# Patient Record
Sex: Female | Born: 1970 | Race: White | Hispanic: No | Marital: Married
Health system: Midwestern US, Academic
[De-identification: ages and names within clinical notes are randomized; demographics above are authoritative.]

---

## 2015-12-13 IMAGING — CR CHEST
1 series · 1 of 1 positions shown · non-contrast
Comparison: None

EXAM: CHEST
REASON FOR EXAM: Productive cough x2 weeks
TECHNIQUE: PA   Lateral

[chest lat]
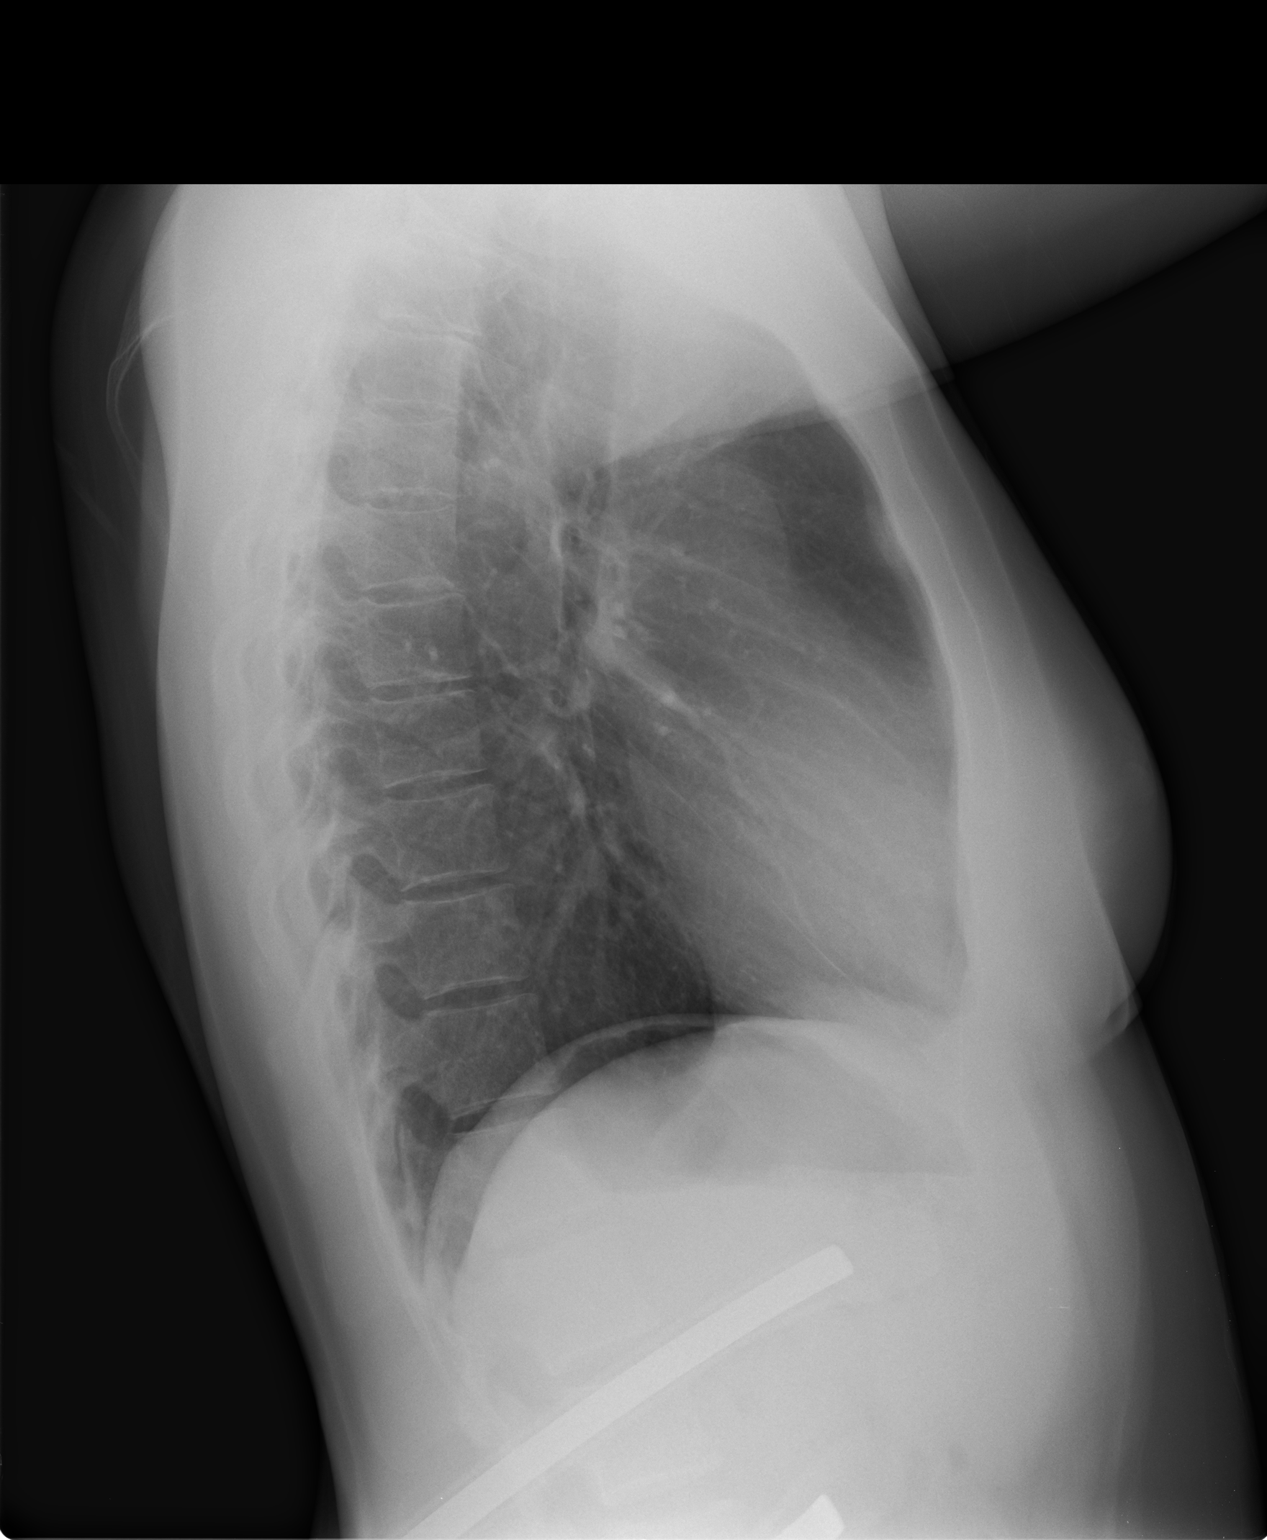

[1 of 1 positions shown; findings below may reference images not displayed]

IMPRESSION: No acute cardiopulmonary process
FINDINGS: Lungs are well aerated.  There is no focal consolidation.  Costophrenic
angles are clear.  Cardiomediastinal silhouette within normal limits.

Comments:  Findings discussed with Hi, RN for Dominick, See at 5511  hours
today.

Dictated by Onacram, Don Lolito
Preliminary report until reviewed and verified by Pajot, Vladilen

Tech Notes: PRODUCTIVE COUGH, FATIGUE, LT SIDED CHEST PAIN, BODY ACHES, SOA X  1 [DATE] WEEKS.  DENIED
PREGNANCY.  SHIELDED.  AB

## 2017-04-17 IMAGING — CR CHEST
2 series · 2 of 2 positions shown · non-contrast
Comparison: none

[chest pa]
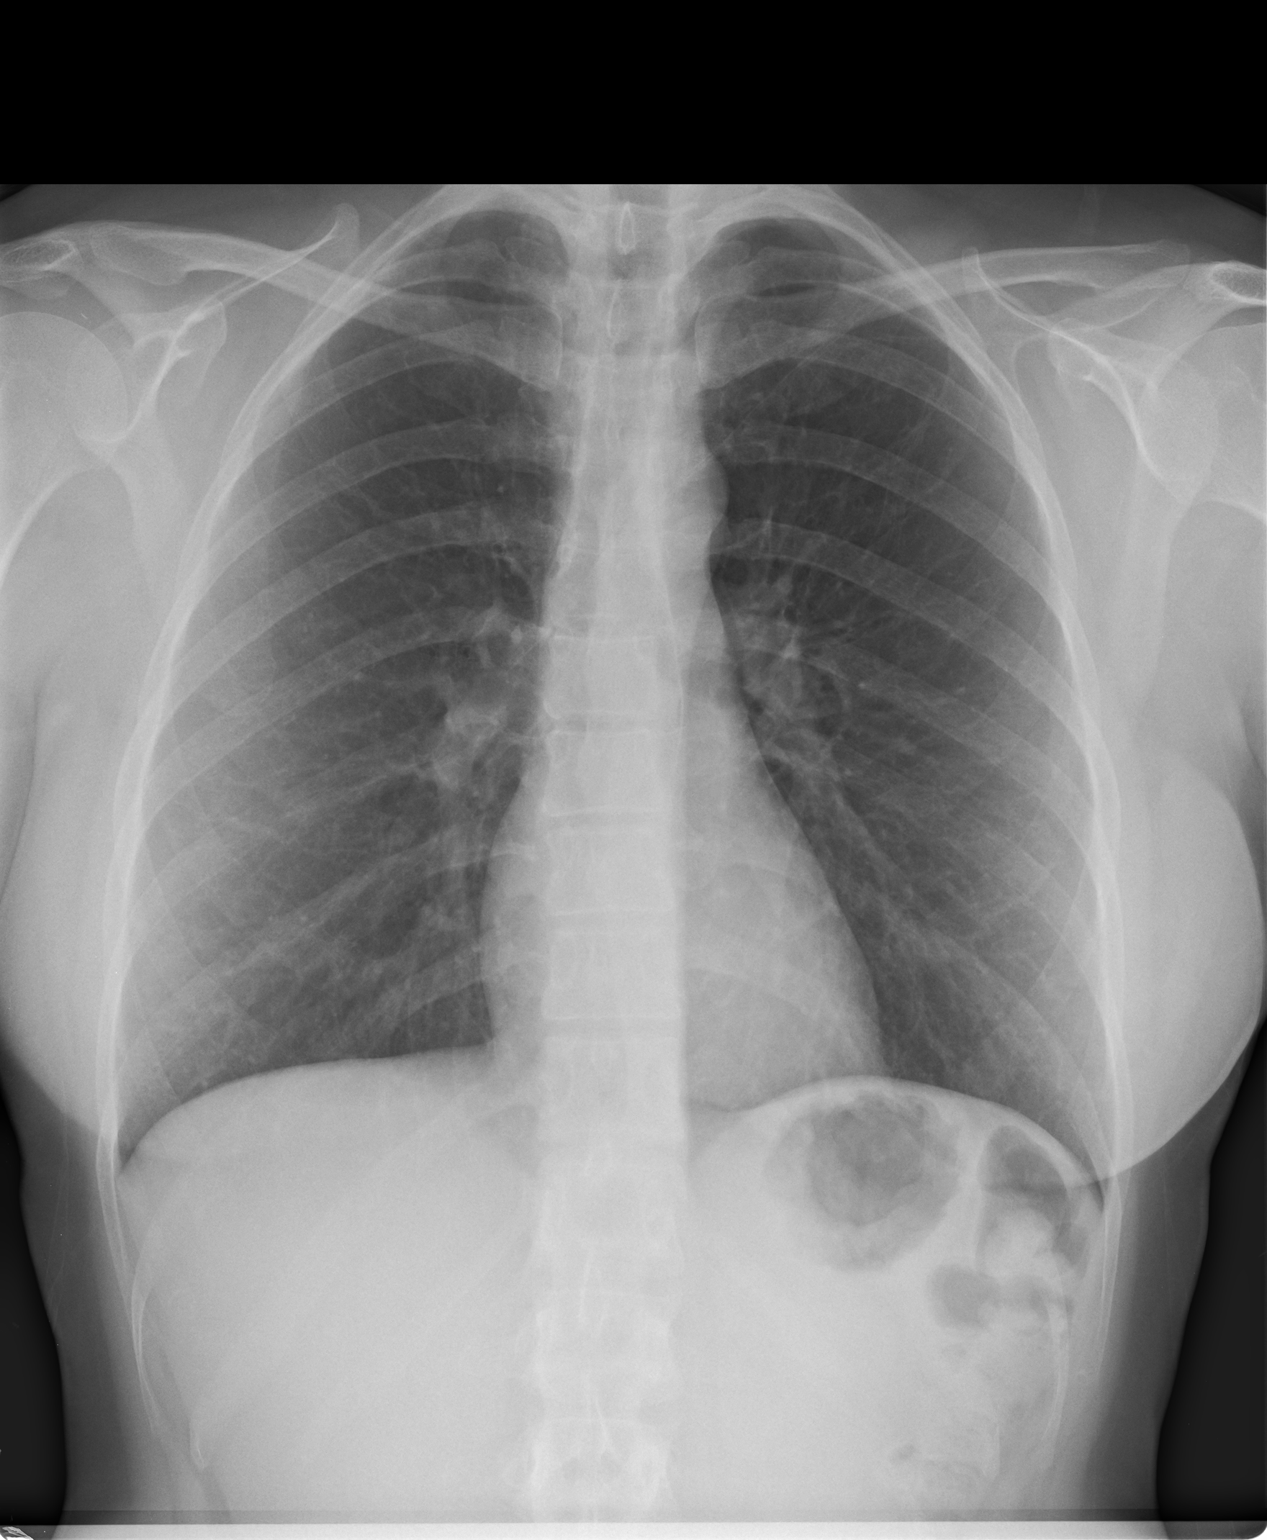

[chest lat]
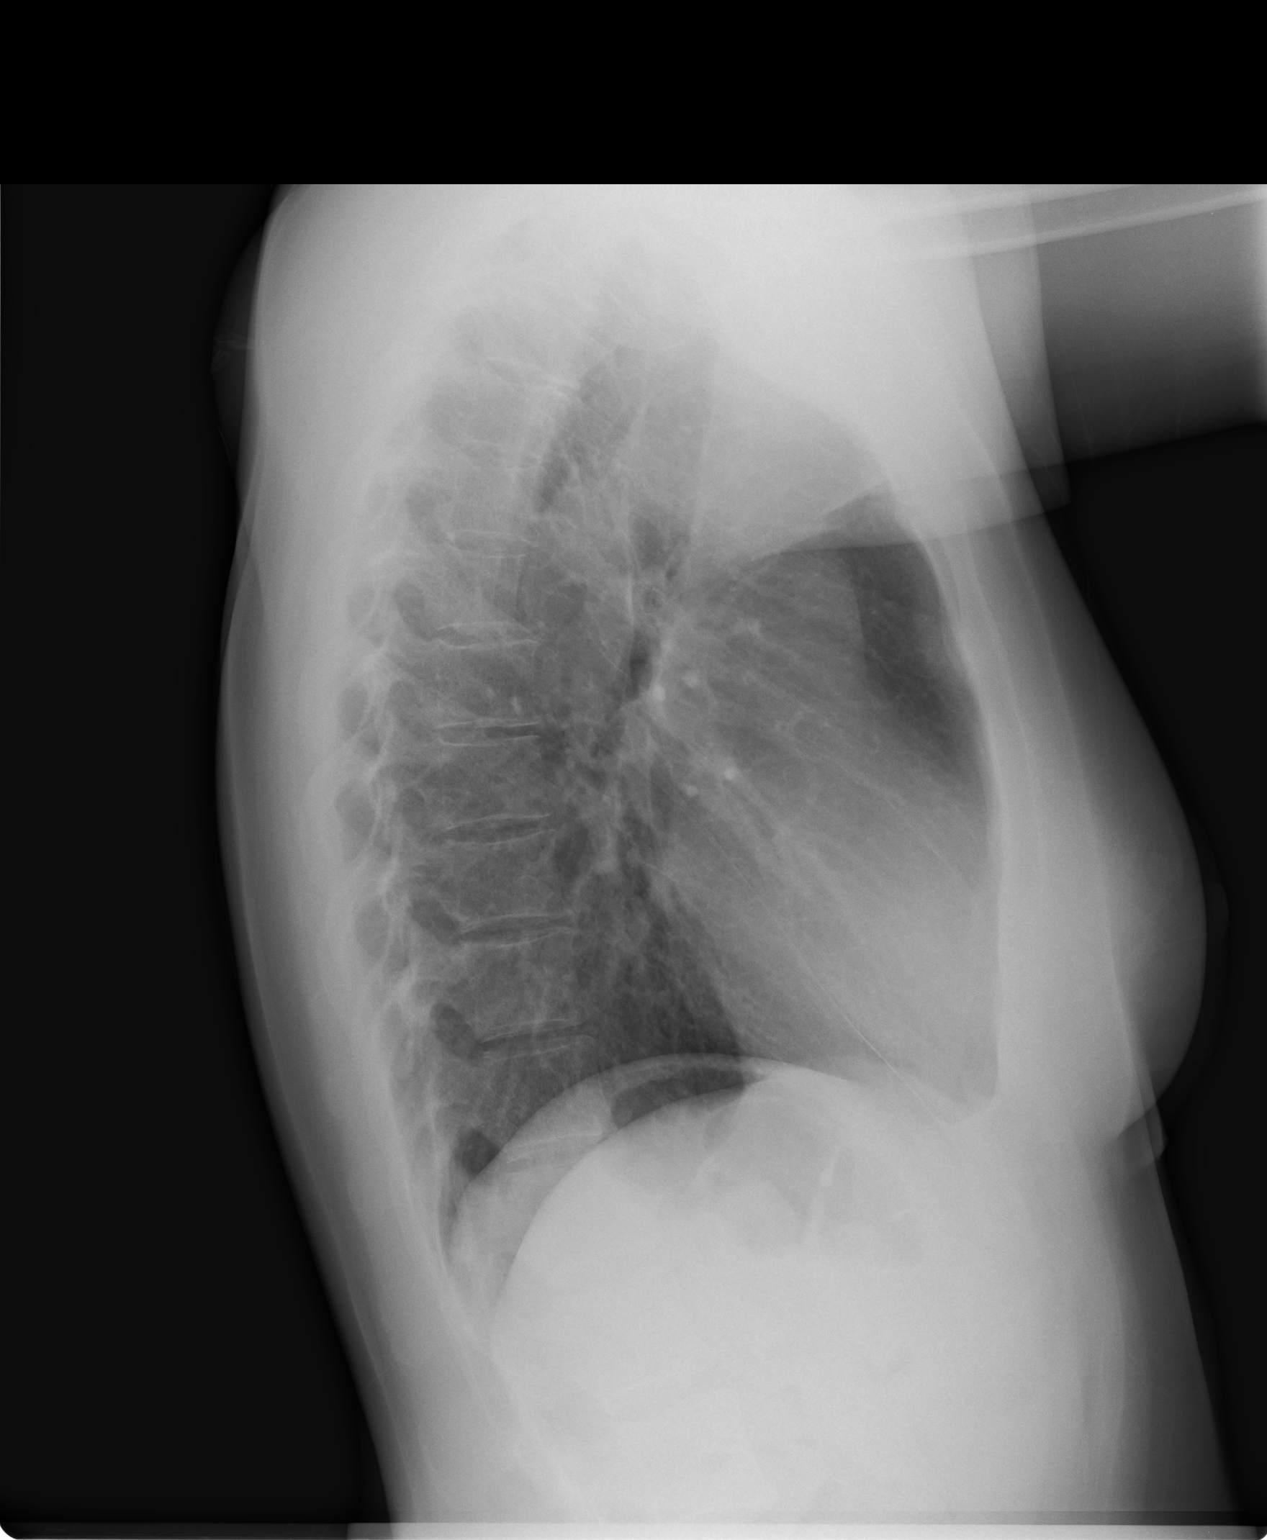

[2 of 2 positions shown; findings below may reference images not displayed]

DIAGNOSTIC STUDIES

EXAM

TWO VIEW CHEST

INDICATION

atypical chest pain
PT C/O SORE THROAT, FATIGUE, PAINS IN UPPER LT CHEST X 2 WEEKS. AB

TECHNIQUE

PA and lateral chest views

COMPARISONS

None

FINDINGS

The cardiomediastinal silhouette and pulmonary vasculature are normal. No confluent infiltrate nor
pleural effusion is identified. Bony structures are unremarkable.

IMPRESSION

No acute chest process.

## 2017-05-10 ENCOUNTER — Ambulatory Visit: Admit: 2017-05-10 | Discharge: 2017-05-11 | Payer: BC Managed Care – PPO

## 2017-05-10 ENCOUNTER — Encounter: Admit: 2017-05-10 | Discharge: 2017-05-10 | Payer: BC Managed Care – PPO

## 2017-05-10 DIAGNOSIS — I200 Unstable angina: Principal | ICD-10-CM

## 2017-05-17 ENCOUNTER — Ambulatory Visit: Admit: 2017-05-17 | Discharge: 2017-05-18 | Payer: BC Managed Care – PPO

## 2017-05-17 ENCOUNTER — Encounter: Admit: 2017-05-17 | Discharge: 2017-05-17 | Payer: BC Managed Care – PPO

## 2017-05-17 DIAGNOSIS — R0789 Other chest pain: Principal | ICD-10-CM

## 2017-05-17 DIAGNOSIS — R0600 Dyspnea, unspecified: ICD-10-CM

## 2020-06-25 ENCOUNTER — Encounter: Admit: 2020-06-25 | Discharge: 2020-06-25 | Payer: BC Managed Care – PPO

## 2020-06-25 DIAGNOSIS — F32A Depression: Secondary | ICD-10-CM

## 2020-06-25 DIAGNOSIS — R519 Generalized headaches: Secondary | ICD-10-CM

## 2021-02-15 ENCOUNTER — Encounter: Admit: 2021-02-15 | Discharge: 2021-02-15 | Payer: BC Managed Care – PPO

## 2021-02-15 ENCOUNTER — Ambulatory Visit: Admit: 2021-02-15 | Discharge: 2021-02-15 | Payer: BC Managed Care – PPO

## 2021-02-15 DIAGNOSIS — Q231 Congenital insufficiency of aortic valve: Secondary | ICD-10-CM

## 2021-06-28 ENCOUNTER — Encounter: Admit: 2021-06-28 | Discharge: 2021-06-28 | Payer: BC Managed Care – PPO

## 2021-08-12 ENCOUNTER — Ambulatory Visit: Admit: 2021-08-12 | Discharge: 2021-08-12 | Payer: BC Managed Care – PPO

## 2021-08-12 DIAGNOSIS — Z1211 Encounter for screening for malignant neoplasm of colon: Secondary | ICD-10-CM

## 2021-08-12 DIAGNOSIS — Z800 Family history of malignant neoplasm of digestive organs: Secondary | ICD-10-CM

## 2021-09-30 ENCOUNTER — Encounter: Admit: 2021-09-30 | Discharge: 2021-09-30 | Payer: BC Managed Care – PPO

## 2021-09-30 DIAGNOSIS — R519 Generalized headaches: Secondary | ICD-10-CM

## 2021-09-30 DIAGNOSIS — F32A Depression: Secondary | ICD-10-CM

## 2021-09-30 DIAGNOSIS — E039 Hypothyroidism, unspecified: Secondary | ICD-10-CM

## 2021-10-11 ENCOUNTER — Encounter: Admit: 2021-10-11 | Discharge: 2021-10-11 | Payer: BC Managed Care – PPO

## 2021-10-11 ENCOUNTER — Ambulatory Visit: Admit: 2021-10-11 | Discharge: 2021-10-11 | Payer: BC Managed Care – PPO

## 2021-10-11 DIAGNOSIS — R519 Generalized headaches: Secondary | ICD-10-CM

## 2021-10-11 DIAGNOSIS — E039 Hypothyroidism, unspecified: Secondary | ICD-10-CM

## 2021-10-11 DIAGNOSIS — F32A Depression: Secondary | ICD-10-CM

## 2021-10-11 MED ORDER — PROPOFOL 10 MG/ML IV EMUL 50 ML (INFUSION)(AM)(OR)
INTRAVENOUS | 0 refills | Status: DC
Start: 2021-10-11 — End: 2021-10-11

## 2021-10-11 MED ORDER — LIDOCAINE (PF) 100 MG/5 ML (2 %) IV SYRG
INTRAVENOUS | 0 refills | Status: DC
Start: 2021-10-11 — End: 2021-10-11

## 2021-10-11 NOTE — Anesthesia Post-Procedure Evaluation
Post-Anesthesia Evaluation    Name: Miranda Mullen      MRN: [id number]     DOB: 1970-05-21     Age: 51 y.o.     Sex: female   __________________________________________________________________________     Procedure Information     Anesthesia Start Date/Time: 10/11/21 1306    Procedure: COLONOSCOPY DIAGNOSTIC WITH SPECIMEN COLLECTION BY BRUSHING/ WASHING - FLEXIBLE    Location: [id number]    Surgeons: Samuel Jester, MD          Post-Anesthesia Vitals  BP: 159/99 (08/29 1345)  Temp: 36.7 C (98.1 F) (08/29 1330)  Pulse: 98 (08/29 1330)  Respirations: 16 PER MINUTE (08/29 1345)  SpO2: 99 % (08/29 1345)  SpO2 Pulse: 84 (08/29 1345)  O2 Device: None (Room air) (08/29 1345)  Height: 167.6 cm (5\' 6" ) (08/29 1220)   Vitals Value Taken Time   BP 159/99 10/11/21 1345   Temp 36.7 C (98.1 F) 10/11/21 1330   Pulse 98 10/11/21 1330   Respirations 16 PER MINUTE 10/11/21 1345   SpO2 99 % 10/11/21 1345   O2 Device None (Room air) 10/11/21 1345   ABP     ART BP           Post Anesthesia Evaluation Note    Evaluation location: Pre/Post  Patient participation: recovered; patient participated in evaluation  Level of consciousness: alert (appropriate following anesthesia and ready to be discharged with responsible adult)  Pain management: adequate    Hydration: normovolemia  Temperature: 36.0C - 38.4C  Airway patency: adequate    Perioperative Events       Post-op nausea and vomiting: no PONV    Postoperative Status  Cardiovascular status: hemodynamically stable  Respiratory status: spontaneous ventilation  Follow-up needed: none        Perioperative Events  There were no known notable events for this encounter.

## 2021-10-11 NOTE — Anesthesia Pre-Procedure Evaluation
Anesthesia Pre-Procedure Evaluation    Name: Miranda Mullen      MRN: [id number]     DOB: 06/11/1970     Age: 51 y.o.     Sex: female   _________________________________________________________________________     Procedure Info:   Procedure Information     Date/Time: 10/11/21 1245    Procedure: COLONOSCOPY DIAGNOSTIC WITH SPECIMEN COLLECTION BY BRUSHING/ WASHING - FLEXIBLE    Location: [id number]    Surgeons: Samuel Jester, MD          Physical Assessment  Vital Signs (last filed in past 24 hours):  BP: 153/88 (08/29 1220)  Temp: 36.6 ?C (97.9 ?F) (08/29 1220)  Respirations: 16 PER MINUTE (08/29 1220)  SpO2: 99 % (08/29 1220)  O2 Device: None (Room air) (08/29 1220)  Height: 167.6 cm (5' 6) (08/29 1220)  Weight: 66 kg (145 lb 8.1 oz) (08/29 1220)  SpO2 Pulse: 97 (08/29 1220)      Patient History   Allergies   Allergen Reactions   ? Azithromycin RASH   ? Erythromycin STOMACH UPSET   ? Sulfa (Sulfonamide Antibiotics) ANAPHYLAXIS        Current Medications    Medication Directions   levonorgestrel/ethinyl estradiol (AVIANE-28, ALESSE-28) 0.1 mg/20 mcg tablet Take one tablet by mouth daily.   levothyroxine (SYNTHROID) 88 mcg tablet Take one tablet by mouth daily 30 minutes before breakfast.   LORazepam (ATIVAN) 0.5 mg tablet Take one tablet by mouth daily.   peg-electrolyte solution (NULYTELY LEMON-LIME) 420 gram oral solution Ok to substitute for any generic Golytely, Gavilyte-C or Gavilyte-G. Mix as directed on package. Refrigerate once mixed. Do not mix greater than 24 hours prior to procedure. Drink 3/4 of bottle between 5pm to 7pm the night before procedure. Drink remaining 1/4 bottle 5 hours prior to procedure.  Indications: emptying of the bowel   venlafaxine XR (EFFEXOR XR) 75 mg capsule Take one capsule by mouth daily. Take with food.       Review of Systems/Medical History        PONV Screening: Non-smoker, Female sex and Hx PONV/motion sickness  No history of anesthetic complications        Pulmonary           Cardiovascular         Exercise tolerance: >4 METS      Beta Blocker therapy: No      Hypertension,                   GI/Hepatic/Renal             Bowel prep        Neuro/Psych           Psychiatric history          Depression          Endocrine/Other           Hypothyroidism (replaced)        Constitution - negative       Physical Exam    Airway Findings      Mallampati: I      TM distance: >3 FB      Neck ROM: full      Mouth opening: good      Airway patency: adequate    Dental Findings: Negative      Cardiovascular Findings:       Rhythm: regular      Rate: normal  Pulmonary Findings:       Breath sounds clear to auscultation.    Abdominal Findings:         Abdomen soft      Bowel sounds normal.    Neurological Findings:       Alert and oriented x 3    Constitutional findings:       No acute distress      Well-developed       Diagnostic Tests  Hematology: No results found for: HGB, HCT, PLTCT, WBC, NEUT, ANC, LYMPH, ALC, ABSLYMPHCT, MONA, AMC, EOSA, ABC, BASOPHILS, MCV, MCH, MCHC, MPV, RDW      General Chemistry: No results found for: NA, K, CL, CO2, GAP, BUN, CR, GLU, CA, KETONES, ALBUMIN, LACTIC, OBSCA, MG, TOTBILI, TOTBILCB, PO4   Coagulation: No results found for: PT, PTT, INR    PAC Plan    Anesthesia Plan    ASA score: 2   Plan: MAC  Induction method: intravenous  NPO status: acceptable      Informed Consent  Anesthetic plan and risks discussed with patient.        Plan discussed with: surgeon/proceduralist and CRNA.      Alerts

## 2021-10-12 ENCOUNTER — Encounter: Admit: 2021-10-12 | Discharge: 2021-10-12 | Payer: BC Managed Care – PPO

## 2021-10-12 DIAGNOSIS — R519 Generalized headaches: Secondary | ICD-10-CM

## 2021-10-12 DIAGNOSIS — F32A Depression: Secondary | ICD-10-CM

## 2021-10-12 DIAGNOSIS — E039 Hypothyroidism, unspecified: Secondary | ICD-10-CM

## 2021-11-10 ENCOUNTER — Ambulatory Visit: Admit: 2021-11-10 | Discharge: 2021-11-10 | Payer: BC Managed Care – PPO

## 2021-11-10 ENCOUNTER — Encounter: Admit: 2021-11-10 | Discharge: 2021-11-10 | Payer: BC Managed Care – PPO

## 2021-11-10 DIAGNOSIS — I209 Angina pectoris, unspecified: Secondary | ICD-10-CM

## 2021-11-10 DIAGNOSIS — I10 Essential (primary) hypertension: Secondary | ICD-10-CM

## 2022-02-18 ENCOUNTER — Encounter: Admit: 2022-02-18 | Discharge: 2022-02-18 | Payer: BC Managed Care – PPO

## 2022-08-08 IMAGING — US ECHOCOMPL
1 series · 12 of 24 positions shown · non-contrast
Comparison: none

[Series 1: us echo 2d, complete · 83 acquisitions, 12 frames shown]
[im 4/83]
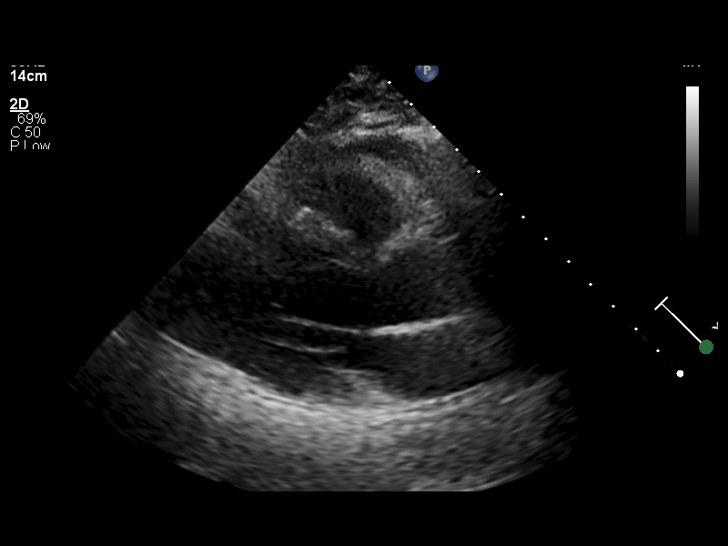
[im 8/83]
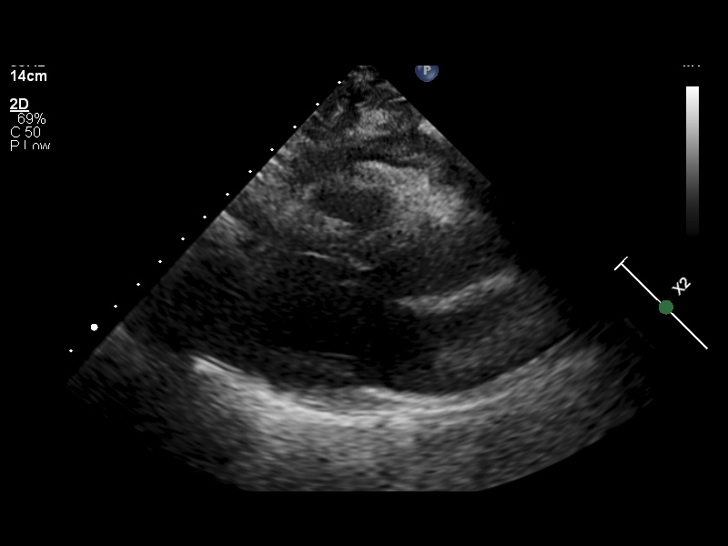
[im 15/83]
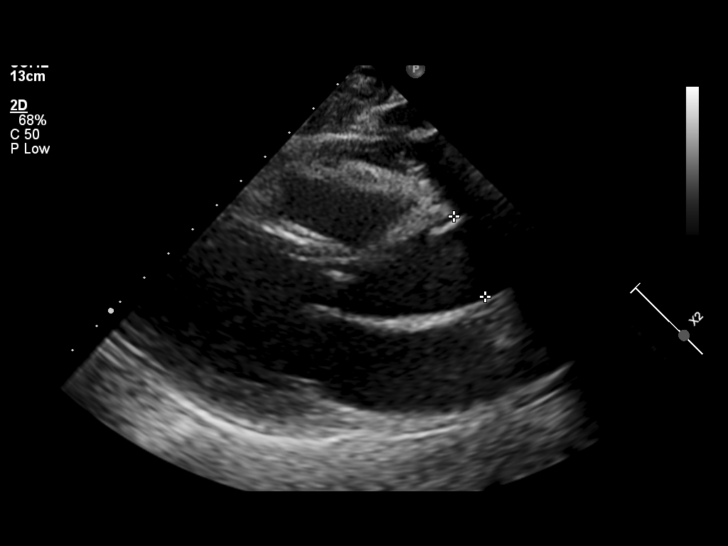
[im 25/83]
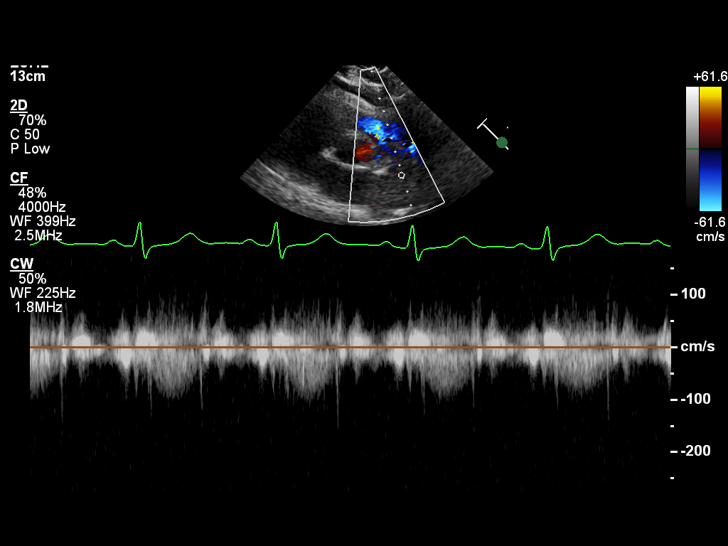
[im 33/83]
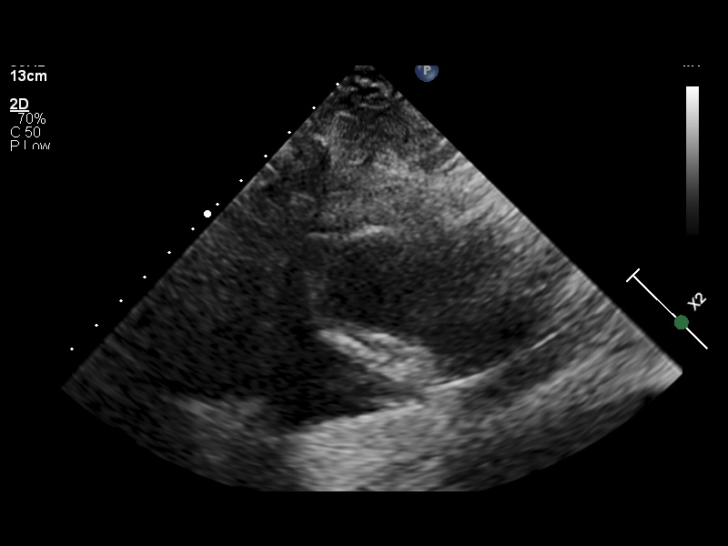
[im 36/83]
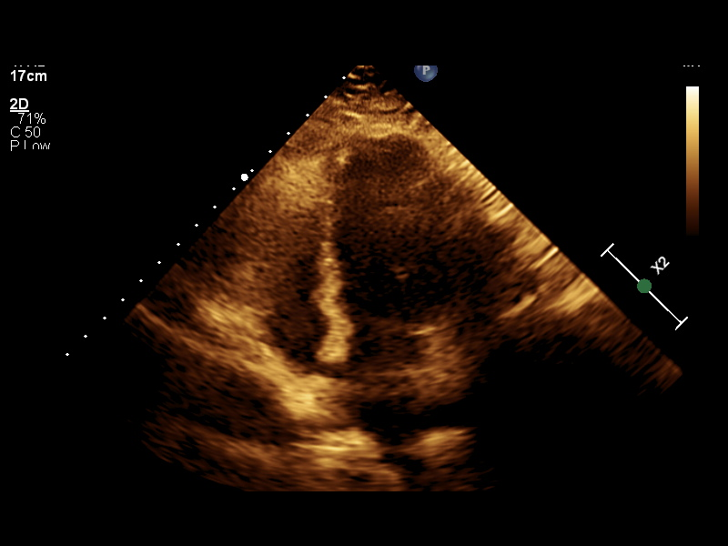
[im 43/83]
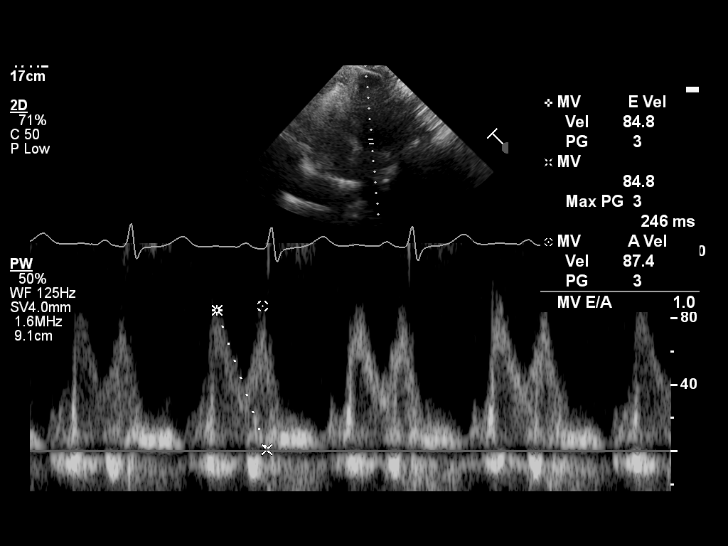
[im 50/83]
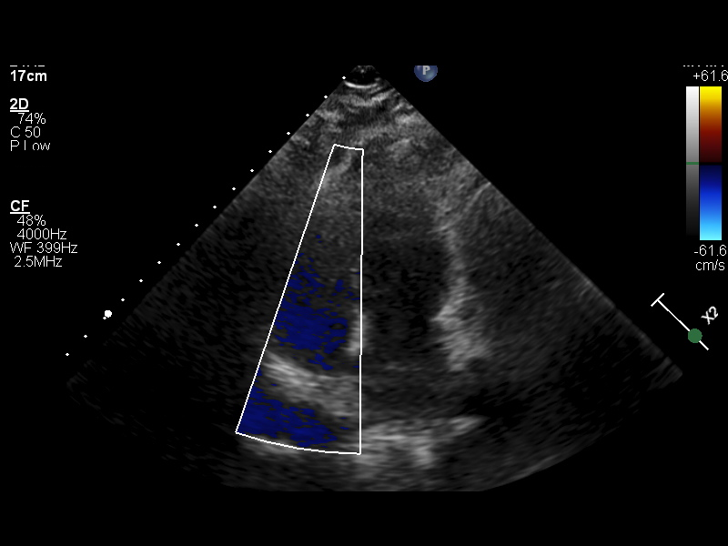
[im 58/83]
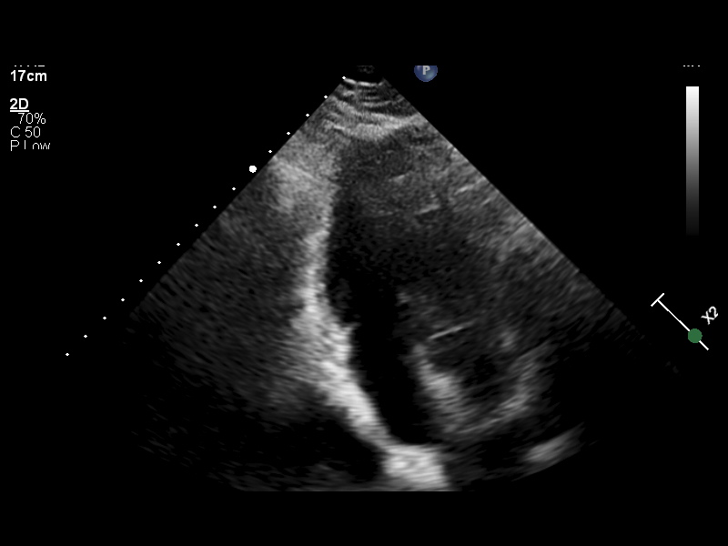
[im 65/83]
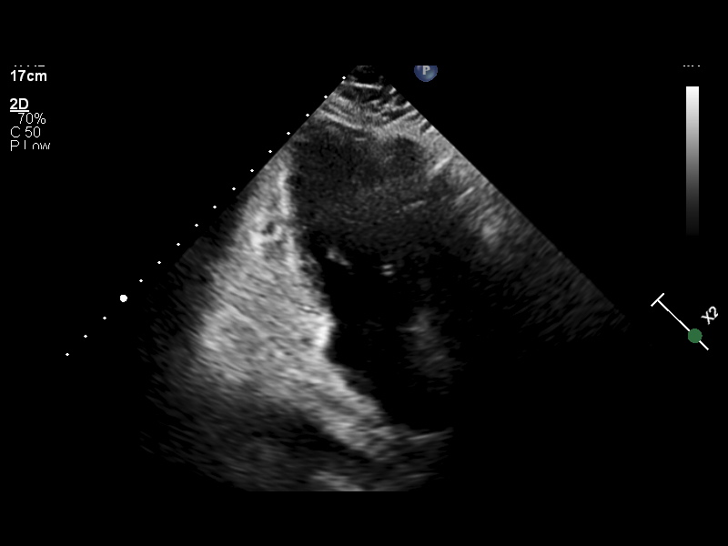
[im 72/83]
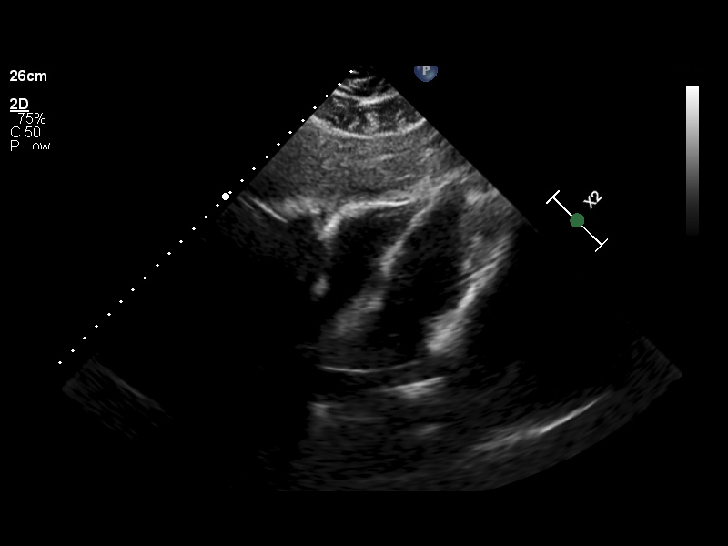
[im 83/83]
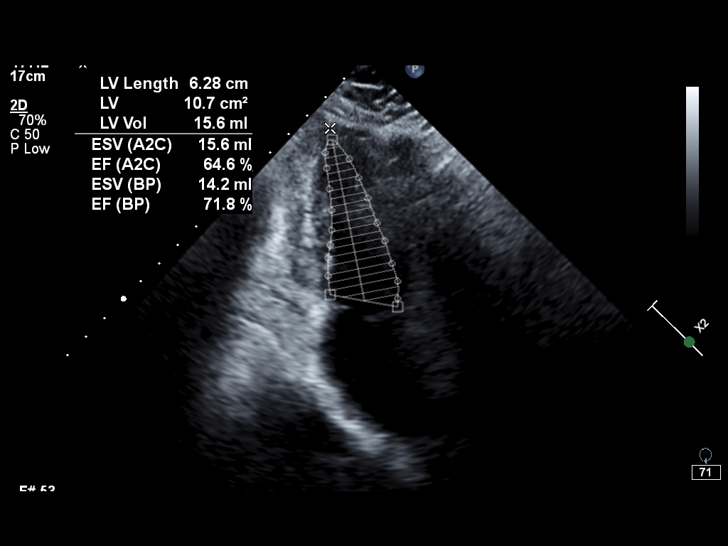

[12 of 24 positions shown; findings below may reference images not displayed]

02/15/21 -  2D + DOPPLER ECHO
Location Performed: [HOSPITAL]

Procedure Ordered By: Referring Dleke

Naimushin:
[facility] of [facility]p: [facility] [[facility]ED]
Sonographer: External Staff
Machine:  [facility]

Indications:           Family history of bicuspid aortic valve

Vitals
Height   Weight   BSA (Calculated)   BP   Comments
167.6 cm (5' 6")   68 kg (149 lb 14.6 oz)   1.78   138/64

Interpretation Summary
Normal left ventricular cavitary dimensions with concentric remodeling
Normal left ventricular systolic function with a visually estimated ejection fraction of  60-65%.
Normal right ventricular cavitary size and systolic function.
Normal left ventricular diastolic function.  Normal left atrial pressure
No left ventricular focal regional wall motion abnormality.
No chamber enlargement.
No hemodynamically significant valvular stenosis or regurgitation.
Limited views of the aortic valve suggest trileaflet morphology without significant stenosis or
insufficiency
Ascending aorta and aortic root are within normal limits of size for age and body surface area
No pericardial effusion.

No prior transthoracic echocardiograms available for direct visual comparison.

Echocardiographic Findings
Left Ventricle   Concentric remodeling. The left ventricular systolic function is normal. The
visually estimated ejection fraction is 60%. There are no segmental wall motion abnormalities.
Normal septal motion. Normal left ventricular diastolic function. Normal left atrial pressure.
Right Ventricle   The right ventricular size, wall thickness and systolic function are normal.
Left Atrium   Normal size.
Right Atrium   Normal size.
IVC/SVC   Normal central venous pressure (0-5 mm Hg).
Mitral Valve   Normal valve structure. No stenosis. Trace regurgitation.
Tricuspid Valve   Normal valve structure. No stenosis. No regurgitation.
Aortic Valve   Normal valve structure. No stenosis. No regurgitation.
Pulmonary   The pulmonic valve was not well seen. Normal valve structure. No stenosis. No
regurgitation.
Aorta   The aortic root and ascending aorta are normal in size.
Pericardium   No pericardial effusion.

Left Ventricular Wall Scoring
Resting   Score Index: 1.000   Percent Normal: 100.0%

The left ventricular wall motion is normal.

Left Heart 2D Measurements (Normal Ranges)
EF (Visual)
60 %
LVIDD
3.8 cm  (Range: 3.8 - 5.2)
LVIDS
1.9 cm  (Range: 2.2 - 3.5)
IVS
0.8 cm  (Range: 0.6 - 0.9)
LV PW
1.0 cm  (Range: 0.6 - 0.9)
LA Size
2.8 cm  (Range: 2.7 - 3.8)

Right Heart 2D   M-Mode Measurements (Normal Ranges) (Range)
RV Basal Dia
3.4 cm  (2.5 - 4.1)
RV Mid Dia
2.4 cm  (1.9 - 3.5)
FEN
10.5 cm2  (<18)
M-Mode TAPSE
1.6 cm  (>1.7)

Left Heart 2D Addnl Measurements (Normal Ranges)
LV Systolic Vol
14 mL  (Range: 14 - 42)
LV Systolic Vol Index
8 mL/m2  (Range: 8 - 24)
LV Diastolic Vol
50 mL  (Range: 46 - 106)
LV Diastolic Vol Index
28 mL/m2  (Range: 29 - 61)
LA Vol
31 mL  (Range: 22 - 52)
LA Vol Index
17.42 mL/m2  (Range: 16 - 34)
LV Mass
101 g  (Range: 67 - 162)
LV Mass Index
57 g/m2  (Range: 43 - 95)
RWT
0.53  (Range: <=0.42)

Aortic Root Measurements (Normal Ranges)
Sinus
3.0 cm  (Range: 2.4 - 3.6)
ARIEL IVAN OHIGGINS
2.5 cm

Doppler (Spectral and Color Flow)
Aortic valve peak velocity
1.4 m/s

Tech Notes:
